# Patient Record
Sex: Female | Born: 1975 | Race: White | Hispanic: No | Marital: Married | Smoking: Never smoker
Health system: Southern US, Community
[De-identification: ages and names within clinical notes are randomized; demographics above are authoritative.]

## PROBLEM LIST (undated history)

## (undated) DIAGNOSIS — E079 Disorder of thyroid, unspecified: Secondary | ICD-10-CM

## (undated) HISTORY — PX: LAPAROSCOPIC ENDOMETRIOSIS FULGURATION: SUR769

## (undated) HISTORY — DX: Disorder of thyroid, unspecified: E07.9

---

## 2000-03-15 ENCOUNTER — Ambulatory Visit (HOSPITAL_COMMUNITY): Admission: RE | Admit: 2000-03-15 | Discharge: 2000-03-15 | Payer: Self-pay | Admitting: Obstetrics and Gynecology

## 2000-06-03 ENCOUNTER — Inpatient Hospital Stay (HOSPITAL_COMMUNITY): Admission: AD | Admit: 2000-06-03 | Discharge: 2000-06-03 | Payer: Self-pay | Admitting: Physical Therapy

## 2000-06-06 ENCOUNTER — Inpatient Hospital Stay (HOSPITAL_COMMUNITY): Admission: AD | Admit: 2000-06-06 | Discharge: 2000-06-08 | Payer: Self-pay | Admitting: Obstetrics and Gynecology

## 2000-07-26 ENCOUNTER — Other Ambulatory Visit: Admission: RE | Admit: 2000-07-26 | Discharge: 2000-07-26 | Payer: Self-pay | Admitting: Obstetrics and Gynecology

## 2001-08-07 ENCOUNTER — Other Ambulatory Visit: Admission: RE | Admit: 2001-08-07 | Discharge: 2001-08-07 | Payer: Self-pay | Admitting: Obstetrics and Gynecology

## 2002-04-02 ENCOUNTER — Other Ambulatory Visit: Admission: RE | Admit: 2002-04-02 | Discharge: 2002-04-02 | Payer: Self-pay | Admitting: Obstetrics and Gynecology

## 2002-08-06 ENCOUNTER — Ambulatory Visit (HOSPITAL_COMMUNITY): Admission: RE | Admit: 2002-08-06 | Discharge: 2002-08-06 | Payer: Self-pay | Admitting: Obstetrics and Gynecology

## 2002-10-15 ENCOUNTER — Inpatient Hospital Stay (HOSPITAL_COMMUNITY): Admission: AD | Admit: 2002-10-15 | Discharge: 2002-10-16 | Payer: Self-pay | Admitting: Obstetrics and Gynecology

## 2002-11-26 ENCOUNTER — Other Ambulatory Visit: Admission: RE | Admit: 2002-11-26 | Discharge: 2002-11-26 | Payer: Self-pay | Admitting: Obstetrics and Gynecology

## 2003-12-31 ENCOUNTER — Other Ambulatory Visit: Admission: RE | Admit: 2003-12-31 | Discharge: 2003-12-31 | Payer: Self-pay | Admitting: Obstetrics and Gynecology

## 2004-07-20 ENCOUNTER — Other Ambulatory Visit: Admission: RE | Admit: 2004-07-20 | Discharge: 2004-07-20 | Payer: Self-pay | Admitting: Obstetrics and Gynecology

## 2005-05-02 ENCOUNTER — Other Ambulatory Visit: Admission: RE | Admit: 2005-05-02 | Discharge: 2005-05-02 | Payer: Self-pay | Admitting: Obstetrics and Gynecology

## 2011-04-21 ENCOUNTER — Other Ambulatory Visit: Payer: Self-pay | Admitting: Obstetrics and Gynecology

## 2011-04-21 DIAGNOSIS — R928 Other abnormal and inconclusive findings on diagnostic imaging of breast: Secondary | ICD-10-CM

## 2011-04-27 ENCOUNTER — Ambulatory Visit
Admission: RE | Admit: 2011-04-27 | Discharge: 2011-04-27 | Disposition: A | Discharge status: Home / Self Care | Payer: Commercial Indemnity | Source: Ambulatory Visit | Attending: Obstetrics and Gynecology | Admitting: Obstetrics and Gynecology

## 2011-04-27 DIAGNOSIS — R928 Other abnormal and inconclusive findings on diagnostic imaging of breast: Secondary | ICD-10-CM

## 2011-05-04 ENCOUNTER — Other Ambulatory Visit: Payer: Self-pay

## 2013-06-07 IMAGING — MG MM DIGITAL DIAGNOSTIC UNILAT R {BCG}
2 series · 2 of 2 positions shown · non-contrast
Comparison: 04/14/2011

CLINICAL DATA: Abnormal screening, right breast

DIGITAL DIAGNOSTIC RIGHT MAMMOGRAM WITHOUT CAD

[R CC]
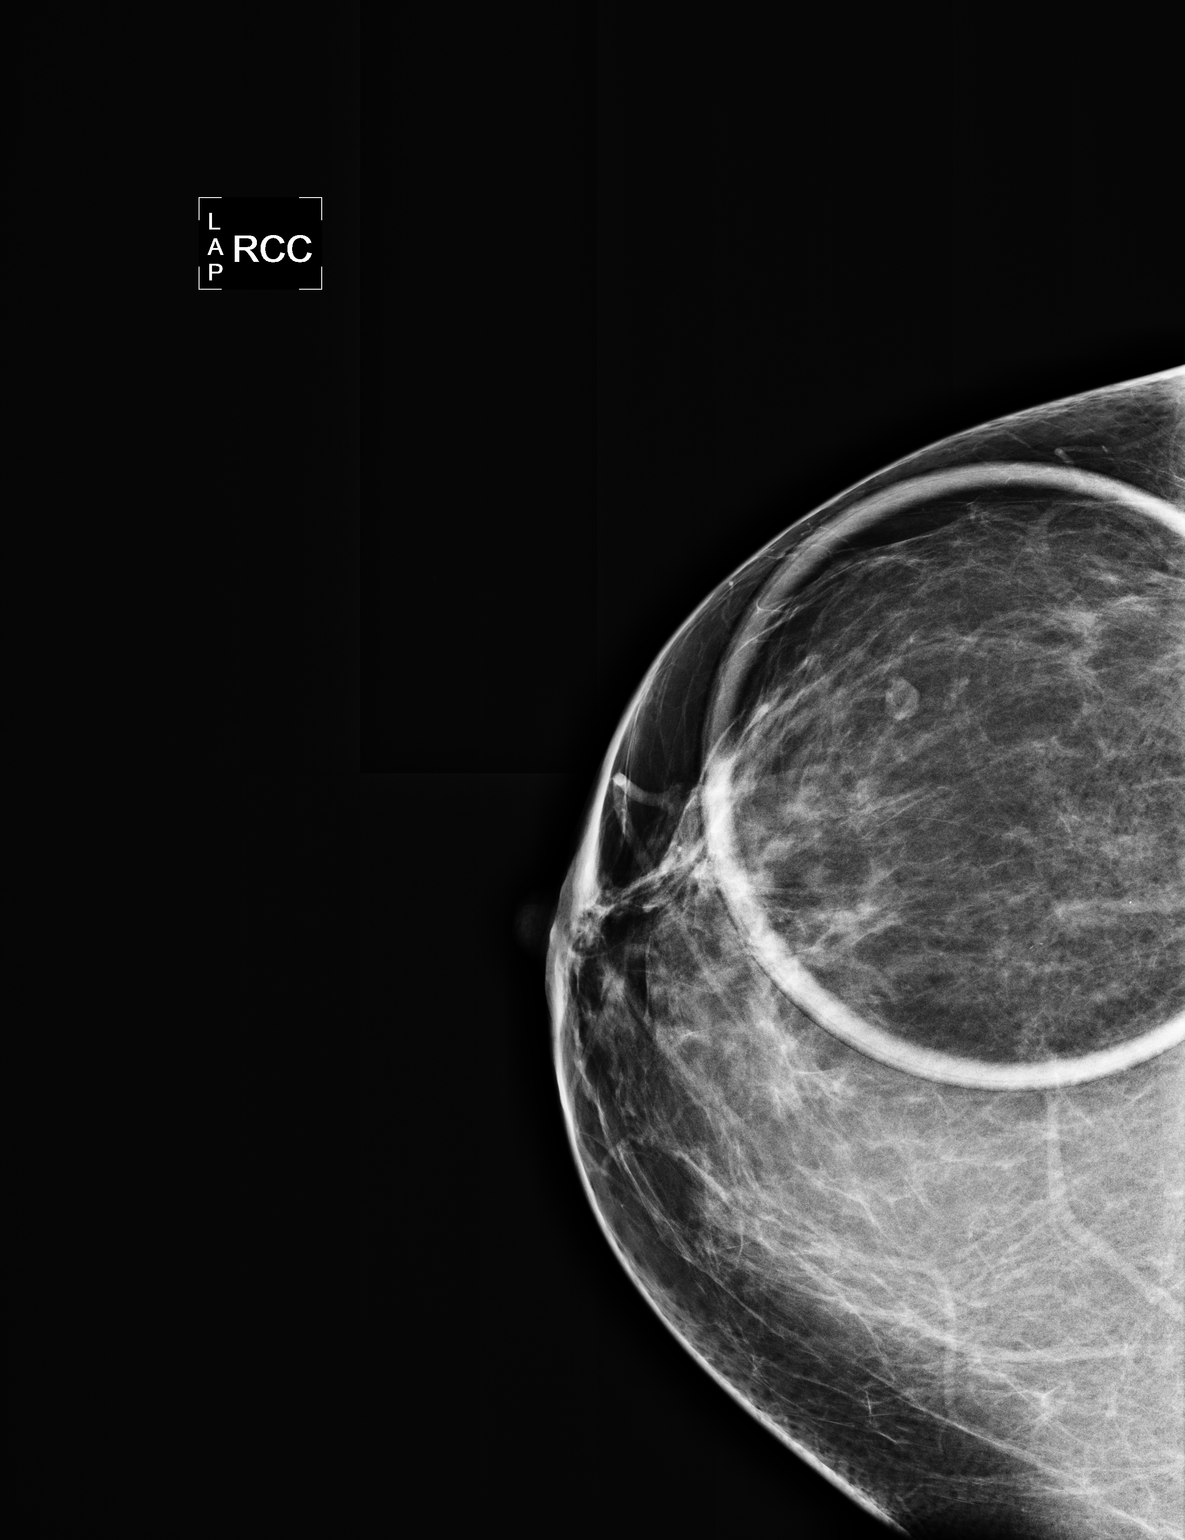

[R MLO]
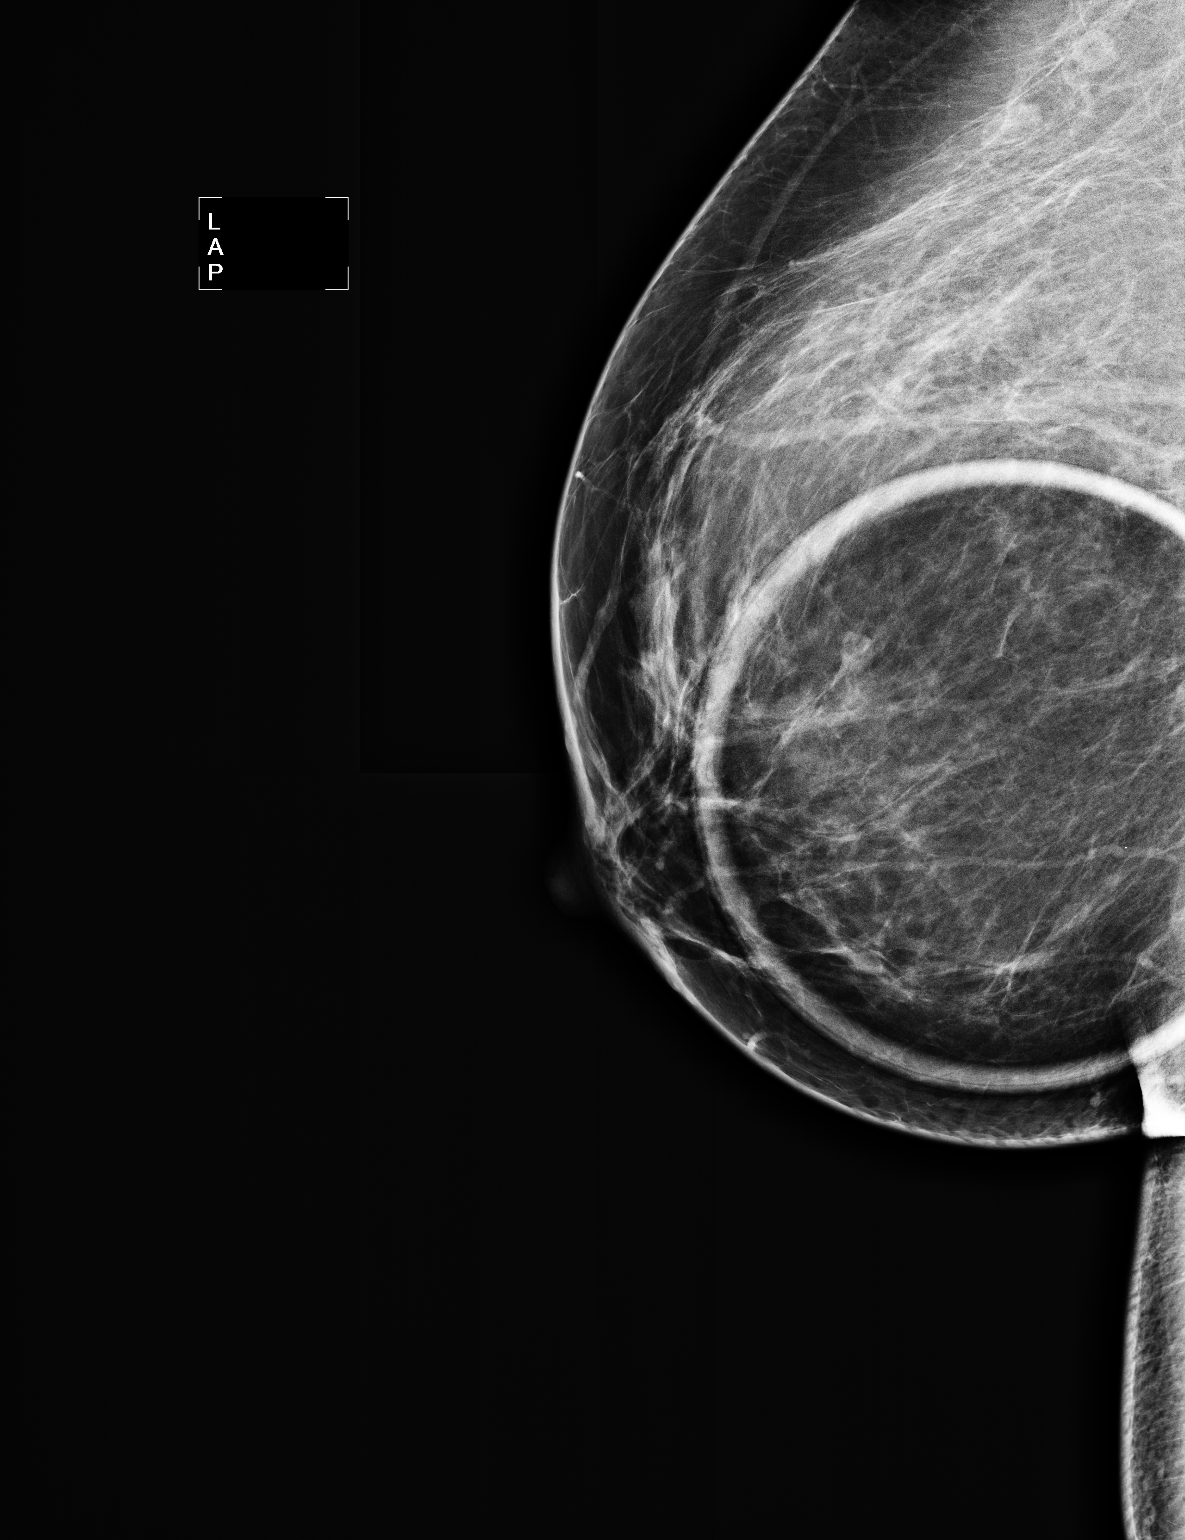

[2 of 2 positions shown; findings below may reference images not displayed]

FINDINGS: Spot compression views in the lateral central aspect of
the right breast, middle third confirm the presence of a round low
density mass with central fat and circumscribed margins compatible
with a lymph node.
IMPRESSION: Lymph node, right breast.  Screening mammography is recommended at
the age of 3 years old.

BI-RADS CATEGORY 2:  Benign finding(s).

## 2015-07-07 ENCOUNTER — Ambulatory Visit (INDEPENDENT_AMBULATORY_CARE_PROVIDER_SITE_OTHER): Payer: Managed Care, Other (non HMO) | Admitting: Physician Assistant

## 2015-07-07 VITALS — BP 140/90 | HR 92 | Temp 98.5°F | Resp 16 | Ht 64.0 in | Wt 273.9 lb

## 2015-07-07 DIAGNOSIS — R059 Cough, unspecified: Secondary | ICD-10-CM

## 2015-07-07 DIAGNOSIS — R05 Cough: Secondary | ICD-10-CM

## 2015-07-07 DIAGNOSIS — G933 Postviral fatigue syndrome: Secondary | ICD-10-CM

## 2015-07-07 DIAGNOSIS — G9331 Postviral fatigue syndrome: Secondary | ICD-10-CM

## 2015-07-07 DIAGNOSIS — R5383 Other fatigue: Secondary | ICD-10-CM

## 2015-07-07 MED ORDER — HYDROCOD POLST-CPM POLST ER 10-8 MG/5ML PO SUER: 5.0000 mL | Freq: Two times a day (BID) | ORAL | Status: AC | PRN

## 2015-07-07 MED ORDER — BECLOMETHASONE DIPROPIONATE 40 MCG/ACT IN AERS: 2.0000 | INHALATION_SPRAY | Freq: Two times a day (BID) | RESPIRATORY_TRACT | Status: AC

## 2015-07-07 NOTE — Patient Instructions (Signed)
Use qvar twice a day for 2-4 weeks. Rinse mouth after use. Ask pharmacist to show you how to use correctly. Use cough syrup twice a day for 2 days, then nightly thereafter. Do not drive while on cough syrup twice a day. Return if not getting better in 2 weeks.

## 2015-07-07 NOTE — Progress Notes (Signed)
[facility] [address], [place] [address] [phone]  Date:  07/07/2015   Name:  Elizabeth Rhodes   DOB:  03-17-1976   MRN:  [id number]  PCP:  No primary care provider on file.    Chief Complaint: Cough   History of Present Illness:  This is a 39 y.o. female who is presenting with cough x 5 weeks. Sometimes dry sometimes productive. When started she had some sinus congestion as well but resolved after 2 weeks. Otherwise feels ok except for cough. Starting to have chest and abdominal soreness from coughing. Coughs more at night. Not currently taking anything for cough because nothing has worked - has tried delsym and mucinex. No hx asthma. Has seasonal allergies in spring. Not a smoker. No hx heartburn. She reports her daughter has a similar illness. Took her to pediatrician and dx'd with walking pneumonia and given an abx.  Review of Systems:  Review of Systems See HPI  There are no active problems to display for this patient.   Prior to Admission medications   Medication Sig Start Date End Date Taking? Authorizing Provider  ibuprofen (ADVIL,MOTRIN) 600 MG tablet Take 600 mg by mouth every 6 (six) hours as needed.   Yes Historical Provider, MD    Allergies  Allergen Reactions  . Penicillins Hives  . Sulfa Antibiotics Hives    Past Surgical History  Procedure Laterality Date  . Laparoscopic endometriosis fulguration      when she 69    Social History  Substance Use Topics  . Smoking status: Never Smoker   . Smokeless tobacco: None  . Alcohol Use: None    History reviewed. No pertinent family history.  Medication list has been reviewed and updated.  Physical Examination:  Physical Exam  Constitutional: She is oriented to person, place, and time. She appears well-developed and well-nourished. No distress.  HENT:  Head: Normocephalic and atraumatic.  Right Ear: Hearing, tympanic membrane, external ear and ear canal normal.  Left  Ear: Hearing, tympanic membrane, external ear and ear canal normal.  Nose: Nose normal.  Mouth/Throat: Uvula is midline, oropharynx is clear and moist and mucous membranes are normal.  Eyes: Conjunctivae and lids are normal. Right eye exhibits no discharge. Left eye exhibits no discharge. No scleral icterus.  Cardiovascular: Normal rate, regular rhythm, normal heart sounds and normal pulses.   No murmur heard. Pulmonary/Chest: Effort normal and breath sounds normal. No respiratory distress. She has no decreased breath sounds. She has no wheezes. She has no rhonchi. She has no rales. She exhibits no tenderness.  Abdominal: Soft. Normal appearance. There is no tenderness.  No epigastric tenderness  Musculoskeletal: Normal range of motion.  Lymphadenopathy:       Head (right side): No submental, no submandibular and no tonsillar adenopathy present.       Head (left side): No submental, no submandibular and no tonsillar adenopathy present.    She has no cervical adenopathy.       Right: No supraclavicular adenopathy present.       Left: No supraclavicular adenopathy present.  Neurological: She is alert and oriented to person, place, and time.  Skin: Skin is warm, dry and intact. No lesion and no rash noted.  Psychiatric: She has a normal mood and affect. Her speech is normal and behavior is normal. Thought content normal.   BP 140/90 mmHg  Pulse 92  Temp(Src) 98.5 F (36.9 C) (Oral)  Resp 16  Ht 5' 4"  (1.626  m)  Wt 273 lb 13.6 oz (124.218 kg)  BMI 46.98 kg/m2  SpO2 98%  Assessment and Plan:  1. Postviral syndrome 2. Cough  Vital wnl. Lung exam normal. Pt feels fine other than cough. Suspect postviral cyclic cough. Will try to break cycle with tussionex BID x 2 days and then QHS thereafter. qvar bid x 2-4 weeks. Return in 2 weeks if symptoms not improving. - chlorpheniramine-HYDROcodone (TUSSIONEX PENNKINETIC ER) 10-8 MG/5ML SUER; Take 5 mLs by mouth every 12 (twelve) hours as needed  for cough.  Dispense: 100 mL; Refill: 0 - beclomethasone (QVAR) 40 MCG/ACT inhaler; Inhale 2 puffs into the lungs 2 (two) times daily.  Dispense: 1 Inhaler; Refill: 0   Benjaman Pott. Drenda Freeze, MHS [facility]  07/07/2015

## 2015-07-30 ENCOUNTER — Other Ambulatory Visit: Payer: Self-pay | Admitting: Physician Assistant

## 2015-07-31 NOTE — Telephone Encounter (Signed)
Do you want to RF?

## 2015-08-03 NOTE — Telephone Encounter (Signed)
I gave qvar to use bid x 2-4 weeks and instructed pt to return if symptoms do not improve. If she is still having symptoms, she needs to return for further eval. Qvar not refilled.
